# Patient Record
Sex: Female | Born: 1980 | Race: Black or African American | Hispanic: No | Marital: Single | Smoking: Former smoker
Health system: Southern US, Community
[De-identification: ages and names within clinical notes are randomized; demographics above are authoritative.]

## PROBLEM LIST (undated history)

## (undated) HISTORY — PX: TUBAL LIGATION: SHX77

---

## 2015-11-02 ENCOUNTER — Emergency Department (HOSPITAL_BASED_OUTPATIENT_CLINIC_OR_DEPARTMENT_OTHER)
Admission: EM | Admit: 2015-11-02 | Discharge: 2015-11-02 | Disposition: A | Discharge status: Home / Self Care | Payer: Medicaid Other | Attending: Emergency Medicine | Admitting: Emergency Medicine

## 2015-11-02 ENCOUNTER — Encounter (HOSPITAL_BASED_OUTPATIENT_CLINIC_OR_DEPARTMENT_OTHER): Payer: Self-pay | Admitting: *Deleted

## 2015-11-02 DIAGNOSIS — J069 Acute upper respiratory infection, unspecified: Secondary | ICD-10-CM | POA: Diagnosis not present

## 2015-11-02 DIAGNOSIS — F17210 Nicotine dependence, cigarettes, uncomplicated: Secondary | ICD-10-CM | POA: Diagnosis not present

## 2015-11-02 DIAGNOSIS — L0100 Impetigo, unspecified: Secondary | ICD-10-CM | POA: Diagnosis not present

## 2015-11-02 DIAGNOSIS — Z880 Allergy status to penicillin: Secondary | ICD-10-CM | POA: Insufficient documentation

## 2015-11-02 DIAGNOSIS — R21 Rash and other nonspecific skin eruption: Secondary | ICD-10-CM | POA: Diagnosis present

## 2015-11-02 MED ORDER — MUPIROCIN CALCIUM 2 % EX CREA: 1.0000 "application " | TOPICAL_CREAM | Freq: Two times a day (BID) | CUTANEOUS | Status: DC

## 2015-11-02 MED ORDER — CEPHALEXIN 250 MG PO CAPS: 250.0000 mg | ORAL_CAPSULE | Freq: Four times a day (QID) | ORAL | Status: DC

## 2015-11-02 NOTE — ED Provider Notes (Signed)
CSN: [id number]     Arrival date & time 11/02/15  7829 History   First MD Initiated Contact with Patient 11/02/15 [phone]     Chief Complaint  Patient presents with  . Rash     (Consider location/radiation/quality/duration/timing/severity/associated sxs/prior Treatment) Patient is a 35 y.o. female presenting with rash. The history is provided by the patient.  Rash Location:  Face Facial rash location:  Nose Quality: dryness   Severity:  Moderate Onset quality:  Gradual Duration:  4 days Timing:  Constant Progression:  Worsening Chronicity:  New Context comment:  Recent chills, runny nose Relieved by:  Nothing Worsened by:  Nothing tried Ineffective treatments:  None tried Associated symptoms: URI   Associated symptoms: no fever     History reviewed. No pertinent past medical history. History reviewed. No pertinent past surgical history. History reviewed. No pertinent family history. Social History  Substance Use Topics  . Smoking status: Current Some Day Smoker    Types: Cigarettes  . Smokeless tobacco: None  . Alcohol Use: No   OB History    No data available     Review of Systems  Constitutional: Negative for fever.  Skin: Positive for rash.  All other systems reviewed and are negative.     Allergies  Penicillins  Home Medications   Prior to Admission medications   Not on File   BP 116/79 mmHg  Pulse 74  Temp(Src) 98.1 F (36.7 C) (Oral)  Resp 18  Ht  (1.448 m)  Wt 140 lb (63.504 kg)  BMI 30.29 kg/m2  SpO2 100%  LMP 11/02/2015 (Exact Date) Physical Exam  Constitutional: She is oriented to person, place, and time. She appears well-developed and well-nourished. No distress.  HENT:  Head: Normocephalic.  Eyes: Conjunctivae are normal.  Neck: Neck supple. No tracheal deviation present.  Cardiovascular: Normal rate and regular rhythm.   Pulmonary/Chest: Effort normal. No respiratory distress.  Abdominal: Soft. She exhibits no distension.   Neurological: She is alert and oriented to person, place, and time.  Skin: Skin is warm and dry. Rash noted.  Scattered small vesicular lesions with yellow crusting around the nose, philtrum and inside nares  Psychiatric: She has a normal mood and affect.    ED Course  Procedures (including critical care time) Labs Review Labs Reviewed - No data to display  Imaging Review No results found. I have personally reviewed and evaluated these images and lab results as part of my medical decision-making.   EKG Interpretation None      MDM   Final diagnoses:  Impetigo  Viral URI    35 y.o. female presents with rash beneath and inside of her nose over the last 2 days. She had preceding upper respiratory symptoms of runny nose and chills that are improving. It appears she has a secondary impetigo that involves the base of her nose and inside of her nares. Due to sensitivity of the area, topical and systemic antibiotics will be provided.   Lyndal Pulley, MD 11/02/15 [phone]

## 2015-11-02 NOTE — ED Notes (Signed)
States awoke with a rash around mouth this past Thursday, states has been having body aches, HA, and cold symptoms since Tuesday. States has only taken Tylenol for HA. Never has had a reaction to tylenol

## 2015-11-02 NOTE — ED Notes (Signed)
Pt states able to eat, drink, swallow and speech all normal, tracheal sounds clear.

## 2015-11-02 NOTE — ED Notes (Signed)
DC instructions reviewed with pt, discussed importance of hand washing, discussed application of ointment to both nares and importance of completing all abx as prescribed by EDP

## 2015-11-02 NOTE — ED Notes (Signed)
Red rash area with sm circular areas noted immediately below rt and lt nares, appears to be inside both nares as well.

## 2015-11-02 NOTE — ED Notes (Signed)
MD at bedside. 

## 2015-11-02 NOTE — Discharge Instructions (Signed)
Impetigo, Adult  Impetigo is an infection of the skin. It commonly occurs in young children, but it can also occur in adults. The infection causes itchy blisters and sores that produce brownish-yellow fluid. As the fluid dries, it forms a thick, honey-colored crust. These skin changes usually occur on the face but can also affect other areas of the body. Impetigo usually goes away in 7-10 days with treatment.  CAUSES  Impetigo is caused by two types of bacteria. It may be caused by staphylococci or streptococci bacteria. These bacteria cause impetigo when they get under the surface of the skin. This often happens after some damage to the skin, such as damage from:  · Cuts, scrapes, or scratches.  · Insect bites, especially when you scratch the area of a bite.  · Chickenpox or other illnesses that cause open skin sores.  · Nail biting or chewing.  Impetigo is contagious and can spread easily from one person to another. This may occur through close skin contact or by sharing towels, clothing, or other items with a person who has the infection.  RISK FACTORS  Some things that can increase the risk of getting this infection include:  · Playing sports that include skin-to-skin contact with others.  · Having a skin condition with open sores.  · Having many skin cuts or scrapes.  · Living in an area that has high humidity levels.  · Having poor hygiene.  · Having high levels of staphylococci in your nose.  SIGNS AND SYMPTOMS  Impetigo usually starts out as small blisters, often on the face. The blisters then break open and turn into tiny sores (lesions) with a yellow crust. In some cases, the blisters cause itching or burning. With scratching, irritation, or lack of treatment, these small lesions may get larger. Scratching can also cause impetigo to spread to other parts of the body. The bacteria can get under the fingernails and spread when you touch another area of your skin.  Other possible symptoms include:  · Larger  blisters.  · Pus.  · Swollen lymph glands.  DIAGNOSIS  This condition is usually diagnosed during a physical exam. A skin sample or sample of fluid from a blister may be taken for lab tests that involve growing bacteria (culture test). This can help confirm the diagnosis or help determine the best treatment.  TREATMENT  Mild impetigo can be treated with prescription antibiotic cream. Oral antibiotic medicine may be used in more severe cases. Medicines for itching may also be used.  HOME CARE INSTRUCTIONS  · Take medicines only as directed by your health care provider.  · To help prevent impetigo from spreading to other body areas:    Keep your fingernails short and clean.    Do not scratch the blisters or sores.    Cover infected areas, if necessary, to keep from scratching.  · Gently wash the infected areas with antibiotic soap and water.  · Soak crusted areas in warm, soapy water using antibiotic soap.    Gently rub the areas to remove crusts. Do not scrub.  · Wash your hands often to avoid spreading this infection.  · Stay home until you have used an antibiotic cream for 48 hours (2 days) or an oral antibiotic medicine for 24 hours (1 day). You should only return to work and activities with other people if your skin shows significant improvement.  PREVENTION   To keep the infection from spreading:  · Stay home until you have used   an antibiotic cream for 48 hours or an oral antibiotic for 24 hours.  · Wash your hands often.  · Do not engage in skin-to-skin contact with other people while you have still have blisters.  · Do not share towels, washcloths, or bedding with others while you have the infection.  SEEK MEDICAL CARE IF:  · You develop more blisters or sores despite treatment.  · Other family members get sores.  · Your skin sores are not improving after 48 hours of treatment.  · You have a fever.  SEEK IMMEDIATE MEDICAL CARE IF:  · You see spreading redness or swelling of the skin around your sores.  · You  see red streaks coming from your sores.  · You develop a sore throat.     This information is not intended to replace advice given to you by your health care provider. Make sure you discuss any questions you have with your health care provider.     Document Released: 10/12/2014 Document Reviewed: 10/12/2014  Elsevier Interactive Patient Education ©2016 [employer]

## 2017-12-16 ENCOUNTER — Other Ambulatory Visit: Payer: Self-pay

## 2017-12-16 ENCOUNTER — Encounter (HOSPITAL_BASED_OUTPATIENT_CLINIC_OR_DEPARTMENT_OTHER): Payer: Self-pay

## 2017-12-16 DIAGNOSIS — K029 Dental caries, unspecified: Secondary | ICD-10-CM | POA: Diagnosis not present

## 2017-12-16 DIAGNOSIS — F17210 Nicotine dependence, cigarettes, uncomplicated: Secondary | ICD-10-CM | POA: Insufficient documentation

## 2017-12-16 DIAGNOSIS — K0889 Other specified disorders of teeth and supporting structures: Secondary | ICD-10-CM | POA: Diagnosis present

## 2017-12-16 NOTE — ED Triage Notes (Signed)
C/o right lower toothache x 1 week-states filling came out "months"-NAD-steady gait

## 2017-12-17 ENCOUNTER — Emergency Department (HOSPITAL_BASED_OUTPATIENT_CLINIC_OR_DEPARTMENT_OTHER)
Admission: EM | Admit: 2017-12-17 | Discharge: 2017-12-17 | Disposition: A | Discharge status: Home / Self Care | Payer: Medicaid Other | Attending: Emergency Medicine | Admitting: Emergency Medicine

## 2017-12-17 DIAGNOSIS — K029 Dental caries, unspecified: Secondary | ICD-10-CM

## 2017-12-17 MED ORDER — CLINDAMYCIN HCL 150 MG PO CAPS
150.0000 mg | ORAL_CAPSULE | Freq: Once | ORAL | Status: AC
  Administered 2017-12-17: 150 mg via ORAL
  Filled 2017-12-17: qty 1

## 2017-12-17 MED ORDER — CLINDAMYCIN HCL 150 MG PO CAPS: 150.0000 mg | ORAL_CAPSULE | Freq: Four times a day (QID) | ORAL | 0 refills | Status: DC

## 2017-12-17 MED ORDER — TRAMADOL HCL 50 MG PO TABS: 50.0000 mg | ORAL_TABLET | Freq: Four times a day (QID) | ORAL | 0 refills | Status: DC | PRN

## 2017-12-17 NOTE — ED Notes (Signed)
Pt verbalizes understanding of d/c instructions and denies any further needs at this time. 

## 2017-12-17 NOTE — ED Provider Notes (Signed)
[facility] EMERGENCY DEPARTMENT Provider Note   CSN: [id number] Arrival date & time: 12/16/17  2128     History   Chief Complaint Chief Complaint  Patient presents with  . Dental Pain    HPI Doris Valentine is a 37 y.o. female.  The history is provided by the patient.  She states that a filling fell out of a right lower molar about 3 months ago.  She was doing well until 4 days ago when she started having pain in the right lower jaw and has noted some swelling.  She rates pain a 10/10.  Pain is worse with chewing and worse when air hits the tooth.  She denies fever or chills.  She has not been able to see a dentist.  She has been taking Aleve and Tylenol with no relief.  History reviewed. No pertinent past medical history.  There are no active problems to display for this patient.   Past Surgical History:  Procedure Laterality Date  . TUBAL LIGATION      OB History    No data available       Home Medications    Prior to Admission medications   Not on File    Family History No family history on file.  Social History Social History   Tobacco Use  . Smoking status: Current Some Day Smoker    Types: Cigarettes  . Smokeless tobacco: Never Used  Substance Use Topics  . Alcohol use: No  . Drug use: No     Allergies   Penicillins   Review of Systems Review of Systems  All other systems reviewed and are negative.    Physical Exam Updated Vital Signs BP 123/69 (BP Location: Left Arm)   Pulse (!) 55   Temp 98.1 F (36.7 C) (Oral)   Resp 16   Wt 66.1 kg (145 lb 11.6 oz)   LMP 11/27/2017   SpO2 100%   BMI 31.53 kg/m   Physical Exam  Nursing note and vitals reviewed.  37 year old female, resting comfortably and in no acute distress. Vital signs are normal. Oxygen saturation is 100%, which is normal. Head is normocephalic and atraumatic. PERRLA, EOMI. Oropharynx is clear.  Several teeth have been previously extracted.  Tooth #31 has a  large dental carry and is moderately tender to percussion.  No gingival swelling or pallor is seen. Neck is nontender and supple without adenopathy or JVD. Back is nontender and there is no CVA tenderness. Lungs are clear without rales, wheezes, or rhonchi. Chest is nontender. Heart has regular rate and rhythm without murmur. Abdomen is soft, flat, nontender without masses or hepatosplenomegaly and peristalsis is normoactive. Extremities have no cyanosis or edema, full range of motion is present. Skin is warm and dry without rash. Neurologic: Mental status is normal, cranial nerves are intact, there are no motor or sensory deficits.  ED Treatments / Results   Procedures Procedures  Medications Ordered in ED Medications  clindamycin (CLEOCIN) capsule 150 mg (not administered)     Initial Impression / Assessment and Plan / ED Course  I have reviewed the triage vital signs and the nursing notes.  Dental pain from caries.  She is given dental resource guide and is discharged with prescription for clindamycin and small number of tramadol tablets.  All records are reviewed, and she has no relevant past visits.  Final Clinical Impressions(s) / ED Diagnoses   Final diagnoses:  Pain due to dental caries  ED Discharge Orders        Ordered    clindamycin (CLEOCIN) 150 MG capsule  Every 6 hours     12/17/17 0301    traMADol (ULTRAM) 50 MG tablet  Every 6 hours PRN     12/17/17 0301       Dione Booze, MD 12/17/17 [phone]

## 2019-11-30 ENCOUNTER — Other Ambulatory Visit: Payer: Self-pay

## 2019-11-30 ENCOUNTER — Emergency Department (HOSPITAL_BASED_OUTPATIENT_CLINIC_OR_DEPARTMENT_OTHER)
Admission: EM | Admit: 2019-11-30 | Discharge: 2019-11-30 | Disposition: A | Discharge status: Home / Self Care | Payer: Medicaid Other | Attending: Emergency Medicine | Admitting: Emergency Medicine

## 2019-11-30 ENCOUNTER — Encounter (HOSPITAL_BASED_OUTPATIENT_CLINIC_OR_DEPARTMENT_OTHER): Payer: Self-pay | Admitting: Emergency Medicine

## 2019-11-30 DIAGNOSIS — L309 Dermatitis, unspecified: Secondary | ICD-10-CM | POA: Diagnosis not present

## 2019-11-30 DIAGNOSIS — Z79899 Other long term (current) drug therapy: Secondary | ICD-10-CM | POA: Insufficient documentation

## 2019-11-30 DIAGNOSIS — F17210 Nicotine dependence, cigarettes, uncomplicated: Secondary | ICD-10-CM | POA: Diagnosis not present

## 2019-11-30 DIAGNOSIS — M79671 Pain in right foot: Secondary | ICD-10-CM | POA: Diagnosis present

## 2019-11-30 MED ORDER — DIPHENHYDRAMINE HCL 25 MG PO TABS: 25.0000 mg | ORAL_TABLET | Freq: Four times a day (QID) | ORAL | 0 refills | Status: DC | PRN

## 2019-11-30 MED ORDER — CLOTRIMAZOLE 1 % EX CREA: TOPICAL_CREAM | CUTANEOUS | 0 refills | Status: DC

## 2019-11-30 NOTE — ED Provider Notes (Signed)
[facility] EMERGENCY DEPARTMENT Provider Note   CSN: [id number] Arrival date & time: 11/30/19  1839     History Chief Complaint  Patient presents with  . Foot Pain    Doris Valentine is a 39 y.o. female with history of tubal ligation.  Otherwise healthy no daily medication use.  Patient reports that approximately 1 month ago she got a pedicure with her friend.  She reports that they used a gel on the bottom of her feet that she has not used before.  She reports that she developed a mild "irritation" on the bottom of her right foot since that time, over the past 1 week she reports irritation has worsened and it has become "itchy".  She reports a moderate intensity itching sensation constant to the sole of her foot nonradiating worsened with ambulation and without alleviating factors.  She reports that she has developed some blisters to the bottom of her foot over the past month.  She called [facility] this week and has a follow-up appointment with them in 4 days.  Patient reports that she has been applying hydrogen peroxide to the bottom of her foot every night but this is not helped.  She denies any fever/chills, abdominal pain, nausea/vomiting, rash elsewhere, airway swelling, sore throat, difficulty swallowing, diarrhea, numbness/tingling, weakness, swelling or any additional concerns.  HPI     History reviewed. No pertinent past medical history.  There are no problems to display for this patient.   Past Surgical History:  Procedure Laterality Date  . CESAREAN SECTION    . TUBAL LIGATION       OB History   No obstetric history on file.     No family history on file.  Social History   Tobacco Use  . Smoking status: Current Some Day Smoker    Types: Cigarettes  . Smokeless tobacco: Never Used  Substance Use Topics  . Alcohol use: No  . Drug use: No    Home Medications Prior to Admission medications   Medication Sig Start Date End  Date Taking? Authorizing Provider  clindamycin (CLEOCIN) 150 MG capsule Take 1 capsule (150 mg total) by mouth every 6 (six) hours. 10/06/56   Delora Fuel, MD  clotrimazole (LOTRIMIN) 1 % cream Apply to affected area 2 times daily 11/30/19   Nuala Alpha A, PA-C  diphenhydrAMINE (BENADRYL) 25 MG tablet Take 1 tablet (25 mg total) by mouth every 6 (six) hours as needed for up to 5 days. 11/30/19 12/05/19  Nuala Alpha A, PA-C  traMADol (ULTRAM) 50 MG tablet Take 1 tablet (50 mg total) by mouth every 6 (six) hours as needed. 02/28/77   Delora Fuel, MD    Allergies    Penicillins  Review of Systems   Review of Systems  Constitutional: Negative for chills and fever.  HENT: Negative.  Negative for sore throat and trouble swallowing.   Respiratory: Negative.  Negative for cough.   Gastrointestinal: Negative for abdominal pain, diarrhea, nausea and vomiting.  Musculoskeletal: Negative.  Negative for arthralgias, myalgias and neck pain.  Skin: Positive for rash (Only to right sole of foot). Negative for wound.  Neurological: Negative.  Negative for weakness and numbness.    Physical Exam Updated Vital Signs BP (!) 129/92 (BP Location: Right Arm)   Pulse 76   Temp 98.9 F (37.2 C)   Resp 16   Ht 4\' 9"  (1.448 m)   Wt 68 kg   LMP 10/30/2019   SpO2 100%  BMI 32.46 kg/m   Physical Exam Constitutional:      General: She is not in acute distress.    Appearance: Normal appearance. She is well-developed. She is not ill-appearing or diaphoretic.  HENT:     Head: Normocephalic and atraumatic.     Right Ear: External ear normal.     Left Ear: External ear normal.     Nose: Nose normal.  Eyes:     General: Vision grossly intact. Gaze aligned appropriately.     Pupils: Pupils are equal, round, and reactive to light.  Neck:     Trachea: Trachea and phonation normal. No tracheal deviation.  Cardiovascular:     Pulses:          Dorsalis pedis pulses are 2+ on the right side and 2+ on  the left side.       Posterior tibial pulses are 2+ on the right side and 2+ on the left side.  Pulmonary:     Effort: Pulmonary effort is normal. No respiratory distress.  Abdominal:     General: There is no distension.     Palpations: Abdomen is soft.     Tenderness: There is no abdominal tenderness. There is no guarding or rebound.  Musculoskeletal:        General: Normal range of motion.     Cervical back: Normal range of motion.     Right knee: Normal.     Left knee: Normal.     Right lower leg: Normal.     Left lower leg: Normal.     Right ankle: Normal.     Right Achilles Tendon: Normal.     Left ankle: Normal.     Left Achilles Tendon: Normal.     Right foot: Tenderness (No tenderness, itching of the bottom of the foot.  Multiple blisters of various sizes present.  See attached picture.) present.     Left foot: Normal.  Feet:     Right foot:     Protective Sensation: 5 sites tested. 5 sites sensed.     Skin integrity: Blister and erythema present. No ulcer or fissure.     Toenail Condition: Right toenails are normal.     Left foot:     Protective Sensation: 5 sites tested. 5 sites sensed.     Skin integrity: Skin integrity normal.     Toenail Condition: Left toenails are normal.  Skin:    General: Skin is warm and dry.     Capillary Refill: Capillary refill takes less than 2 seconds.  Neurological:     Mental Status: She is alert.     GCS: GCS eye subscore is 4. GCS verbal subscore is 5. GCS motor subscore is 6.     Comments: Speech is clear and goal oriented, follows commands Major Cranial nerves without deficit, no facial droop Moves extremities without ataxia, coordination intact  Psychiatric:        Behavior: Behavior normal.       ED Results / Procedures / Treatments   Labs (all labs ordered are listed, but only abnormal results are displayed) Labs Reviewed - No data to display  EKG None  Radiology No results found.  Procedures Procedures  (including critical care time)  Medications Ordered in ED Medications - No data to display  ED Course  I have reviewed the triage vital signs and the nursing notes.  Pertinent labs & imaging results that were available during my care of the patient were reviewed by me  and considered in my medical decision making (see chart for details).    MDM Rules/Calculators/A&P                     39 year old female without history of immunocompromising disease presents today with worsening itching of the bottom of her right foot along with mild erythema and blister formation as pictured above.  This began after having a pedicure with her friend.  She has no systemic symptoms, no fever/chills or other rashes/lesions.  She is neurovascular intact to the foot with good capillary refill, sensation and range of motion with all joints.  She has no evidence of anaphylaxis or allergic reaction at this time.  She does report that she has been applying hydrogen peroxide to the foot every night which may be worsening her symptoms.  There is no evidence of a bacterial infection today necessitating antibiotics, suspect erythema may be secondary to irritation and skin breakdown from patient's use of hydroperoxide every night.  Additionally suspect her initial symptoms may been from a fungal infection.  Will have patient begin antihistamines and antifungal medications I have encouraged her to [facility].  I have also given her referral to podiatry if for some reason she is unable to be seen by the foot and ankle specialist.  Do not feel any imaging or blood work is indicated at this time.  No evidence of cellulitis, no concern for SJS, TN, TSS, tick borne illness, syphilis or other life-threatening condition at this time.  Of note patient denies chance of pregnancy today, she reports that she has tubal ligation.  Patient states understanding that  medications given or prescribed today may result in harm to of a pregnancy and she accepts these risks and still chooses not to be pregnancy tested and proceed with medications.    At this time there does not appear to be any evidence of an acute emergency medical condition and the patient appears stable for discharge with appropriate outpatient follow up. Diagnosis was discussed with patient who verbalizes understanding of care plan and is agreeable to discharge. I have discussed return precautions with patient who verbalizes understanding of return precautions. Patient encouraged to follow-up with their PCP and podiatry. All questions answered.  Patient's case discussed with Dr. Criss Alvine who agrees with plan to discharge with follow-up.   Note: Portions of this report may have been transcribed using voice recognition software. Every effort was made to ensure accuracy; however, inadvertent computerized transcription errors may still be present. Final Clinical Impression(s) / ED Diagnoses Final diagnoses:  Dermatitis of right foot    Rx / DC Orders ED Discharge Orders         Ordered    clotrimazole (LOTRIMIN) 1 % cream     11/30/19 2051    diphenhydrAMINE (BENADRYL) 25 MG tablet  Every 6 hours PRN     11/30/19 2051           Elizabeth Palau 11/30/19 2053    Pricilla Loveless, MD 12/01/19 [phone]

## 2019-11-30 NOTE — Discharge Instructions (Addendum)
You have been diagnosed today with dermatitis of right foot.  At this time there does not appear to be the presence of an emergent medical condition, however there is always the potential for conditions to change. Please read and follow the below instructions.  Please return to the Emergency Department immediately for any new or worsening symptoms. Please be sure to follow up with your Primary Care Provider within one week regarding your visit today; please call their office to schedule an appointment even if you are feeling better for a follow-up visit. Please stop applying the hydroperoxide and other harsh chemicals to your foot as this is likely worsening your symptoms and causing skin breakdown. You may begin taking the antihistamine Benadryl as prescribed to help with your itching, this medication may make you drowsy so do not drive or perform any dangerous activities after taking Benadryl.  Do not take this medication with alcohol or other sedating medications. Please begin using the antifungal medication prescribed onto the bottom of your foot to help with your symptoms. Please go to your foot and ankle specialist appointment on the third as scheduled.  If you are unable to go to that doctor for some reason you may follow-up with the podiatrist Dr. Gala Lewandowsky under discharge paperwork.  Get help right away if: You have a very bad headache. You have neck pain. Your neck is stiff. You throw up (vomit). You feel very sleepy. You see red streaks coming from the area. Your bone or joint near the area hurts after the skin has healed. The area turns darker. You have trouble breathing. You have fever or swelling You have any new/concerning or worsening of symptoms  Please read the additional information packets attached to your discharge summary.  Do not take your medicine if  develop an itchy rash, swelling in your mouth or lips, or difficulty breathing; call 911 and seek immediate  emergency medical attention if this occurs.  Note: Portions of this text may have been transcribed using voice recognition software. Every effort was made to ensure accuracy; however, inadvertent computerized transcription errors may still be present.

## 2019-11-30 NOTE — ED Triage Notes (Signed)
R foot pain and itching x 1 month. States blisters developed in the past week. Unsure if related to pedicure.

## 2020-02-24 ENCOUNTER — Encounter (HOSPITAL_BASED_OUTPATIENT_CLINIC_OR_DEPARTMENT_OTHER): Payer: Self-pay | Admitting: *Deleted

## 2020-02-24 ENCOUNTER — Emergency Department (HOSPITAL_BASED_OUTPATIENT_CLINIC_OR_DEPARTMENT_OTHER)
Admission: EM | Admit: 2020-02-24 | Discharge: 2020-02-24 | Disposition: A | Discharge status: Home / Self Care | Payer: Medicaid Other | Attending: Emergency Medicine | Admitting: Emergency Medicine

## 2020-02-24 ENCOUNTER — Other Ambulatory Visit: Payer: Self-pay

## 2020-02-24 DIAGNOSIS — Z880 Allergy status to penicillin: Secondary | ICD-10-CM | POA: Insufficient documentation

## 2020-02-24 DIAGNOSIS — N899 Noninflammatory disorder of vagina, unspecified: Secondary | ICD-10-CM | POA: Diagnosis present

## 2020-02-24 DIAGNOSIS — N758 Other diseases of Bartholin's gland: Secondary | ICD-10-CM | POA: Insufficient documentation

## 2020-02-24 DIAGNOSIS — F17210 Nicotine dependence, cigarettes, uncomplicated: Secondary | ICD-10-CM | POA: Insufficient documentation

## 2020-02-24 MED ORDER — CLINDAMYCIN HCL 150 MG PO CAPS: 300.0000 mg | ORAL_CAPSULE | Freq: Three times a day (TID) | ORAL | 0 refills | Status: DC

## 2020-02-24 NOTE — ED Triage Notes (Signed)
Pt c/o ? Abscess to right vaginal area

## 2020-02-24 NOTE — ED Provider Notes (Signed)
[facility] EMERGENCY DEPARTMENT Provider Note  CSN: [id number] Arrival date & time: 02/24/20 0029  Chief Complaint(s) Abscess  HPI Doris Valentine is a 39 y.o. female   HPI CC: groin pain  Onset/Duration: 2 days Timing: constant Location: right groin Quality: aching and shooting Severity: moderate to severe Modifying Factors:  Improved by: nothing  Worsened by: palpating, sitting Associated Signs/Symptoms:  Pertinent (+):none   Pertinent (-): vaginal discharge, bleeding, drainage, redness, swelling, fever, chills, dysuria Context: patient feels a painful nodule  Past Medical History History reviewed. No pertinent past medical history. There are no problems to display for this patient.  Home Medication(s) Prior to Admission medications   Medication Sig Start Date End Date Taking? Authorizing Provider  clindamycin (CLEOCIN) 150 MG capsule Take 1 capsule (150 mg total) by mouth every 6 (six) hours. 12/17/17   Dione Booze, MD  clotrimazole (LOTRIMIN) 1 % cream Apply to affected area 2 times daily 11/30/19   Harlene Salts A, PA-C  diphenhydrAMINE (BENADRYL) 25 MG tablet Take 1 tablet (25 mg total) by mouth every 6 (six) hours as needed for up to 5 days. 11/30/19 12/05/19  Harlene Salts A, PA-C  traMADol (ULTRAM) 50 MG tablet Take 1 tablet (50 mg total) by mouth every 6 (six) hours as needed. 12/17/17   Dione Booze, MD                                                                                                                                    Past Surgical History Past Surgical History:  Procedure Laterality Date  . CESAREAN SECTION    . TUBAL LIGATION     Family History History reviewed. No pertinent family history.  Social History Social History   Tobacco Use  . Smoking status: Current Some Day Smoker    Types: Cigarettes  . Smokeless tobacco: Never Used  Substance Use Topics  . Alcohol use: No  . Drug use: No   Allergies Penicillins  Review  of Systems Review of Systems All other systems are reviewed and are negative for acute change except as noted in the HPI  Physical Exam Vital Signs  I have reviewed the triage vital signs BP (!) 117/56 (BP Location: Right Arm)   Pulse 61   Temp 98.4 F (36.9 C)   Resp 20   Ht 4\' 9"  (1.448 m)   Wt 77.1 kg   LMP 02/08/2020   SpO2 100%   BMI 36.79 kg/m   Physical Exam Vitals reviewed. Exam conducted with a chaperone present.  Constitutional:      General: She is not in acute distress.    Appearance: She is well-developed. She is not diaphoretic.  HENT:     Head: Normocephalic and atraumatic.     Right Ear: External ear normal.     Left Ear: External ear normal.     Nose: Nose normal.  Eyes:     General:  No scleral icterus.    Conjunctiva/sclera: Conjunctivae normal.  Neck:     Trachea: Phonation normal.  Cardiovascular:     Rate and Rhythm: Normal rate and regular rhythm.  Pulmonary:     Effort: Pulmonary effort is normal. No respiratory distress.     Breath sounds: No stridor.  Abdominal:     General: There is no distension.  Genitourinary:   Musculoskeletal:        General: Normal range of motion.     Cervical back: Normal range of motion.  Neurological:     Mental Status: She is alert and oriented to person, place, and time.  Psychiatric:        Behavior: Behavior normal.     ED Results and Treatments Labs (all labs ordered are listed, but only abnormal results are displayed) Labs Reviewed - No data to display                                                                                                                       EKG  EKG Interpretation  Date/Time:    Ventricular Rate:    PR Interval:    QRS Duration:   QT Interval:    QTC Calculation:   R Axis:     Text Interpretation:        Radiology No results found.  Pertinent labs & imaging results that were available during my care of the patient were reviewed by me and considered in my  medical decision making (see chart for details).  Medications Ordered in ED Medications - No data to display                                                                                                                                  Procedures Ultrasound ED Soft Tissue  Date/Time: 02/24/2020 6:06 AM Performed by: Nira Conn, MD Authorized by: Nira Conn, MD   Procedure details:    Indications: localization of abscess     Transverse view:  Visualized   Longitudinal view:  Visualized   Images: archived   Location:    Location: perineum     Side:  Right Findings:     no abscess present    no cellulitis present    no foreign body present Comments:     Inflamed Bartholin cyst    (including critical care time)  Medical Decision Making / ED Course I have  reviewed the nursing notes for this encounter and the patient's prior records (if available in EHR or on provided paperwork).   Doris Valentine was evaluated in Emergency Department on 02/24/2020 for the symptoms described in the history of present illness. She was evaluated in the context of the global COVID-19 pandemic, which necessitated consideration that the patient might be at risk for infection with the SARS-CoV-2 virus that causes COVID-19. Institutional protocols and algorithms that pertain to the evaluation of patients at risk for COVID-19 are in a state of rapid change based on information released by regulatory bodies including the CDC and federal and state organizations. These policies and algorithms were followed during the patient's care in the ED.  Presentation is consistent with inflamed Bartholin cyst.  No drainable abscess noted.  Will provide patient with short course of clindamycin.  Recommend PCP follow-up      Final Clinical Impression(s) / ED Diagnoses Final diagnoses:  None   The patient appears reasonably screened and/or stabilized for discharge and I doubt any other medical  condition or other [facility] requiring further screening, evaluation, or treatment in the ED at this time prior to discharge. Safe for discharge with strict return precautions.  Disposition: Discharge  Condition: Good  I have discussed the results, Dx and Tx plan with the patient/family who expressed understanding and agree(s) with the plan. Discharge instructions discussed at length. The patient/family was given strict return precautions who verbalized understanding of the instructions. No further questions at time of discharge.    ED Discharge Orders         Ordered    clindamycin (CLEOCIN) 150 MG capsule  3 times daily     02/24/20 0603           Follow Up: Glory Buff, MD [place] [address] [phone]  Schedule an appointment as soon as possible for a visit        This chart was dictated using voice recognition software.  Despite best efforts to proofread,  errors can occur which can change the documentation meaning.   Fatima Blank, MD 02/24/20 [phone]

## 2020-12-13 ENCOUNTER — Emergency Department (HOSPITAL_BASED_OUTPATIENT_CLINIC_OR_DEPARTMENT_OTHER)
Admission: EM | Admit: 2020-12-13 | Discharge: 2020-12-13 | Disposition: A | Discharge status: Home / Self Care | Payer: Medicaid Other | Attending: Emergency Medicine | Admitting: Emergency Medicine

## 2020-12-13 ENCOUNTER — Other Ambulatory Visit: Payer: Self-pay

## 2020-12-13 ENCOUNTER — Emergency Department (HOSPITAL_BASED_OUTPATIENT_CLINIC_OR_DEPARTMENT_OTHER): Payer: Medicaid Other

## 2020-12-13 ENCOUNTER — Encounter (HOSPITAL_BASED_OUTPATIENT_CLINIC_OR_DEPARTMENT_OTHER): Payer: Self-pay

## 2020-12-13 DIAGNOSIS — M25512 Pain in left shoulder: Secondary | ICD-10-CM | POA: Insufficient documentation

## 2020-12-13 DIAGNOSIS — Z87891 Personal history of nicotine dependence: Secondary | ICD-10-CM | POA: Insufficient documentation

## 2020-12-13 DIAGNOSIS — R202 Paresthesia of skin: Secondary | ICD-10-CM | POA: Insufficient documentation

## 2020-12-13 DIAGNOSIS — M62838 Other muscle spasm: Secondary | ICD-10-CM | POA: Diagnosis not present

## 2020-12-13 LAB — CBC WITH DIFFERENTIAL/PLATELET
Abs Immature Granulocytes: 0.02 10*3/uL (ref 0.00–0.07)
Basophils Absolute: 0 10*3/uL (ref 0.0–0.1)
Basophils Relative: 0 %
Eosinophils Absolute: 0.3 10*3/uL (ref 0.0–0.5)
Eosinophils Relative: 4 %
HCT: 38.4 % (ref 36.0–46.0)
Hemoglobin: 12.3 g/dL (ref 12.0–15.0)
Immature Granulocytes: 0 %
Lymphocytes Relative: 45 %
Lymphs Abs: 3.3 10*3/uL (ref 0.7–4.0)
MCH: 25.2 pg — ABNORMAL LOW (ref 26.0–34.0)
MCHC: 32 g/dL (ref 30.0–36.0)
MCV: 78.7 fL — ABNORMAL LOW (ref 80.0–100.0)
Monocytes Absolute: 0.6 10*3/uL (ref 0.1–1.0)
Monocytes Relative: 8 %
Neutro Abs: 3.2 10*3/uL (ref 1.7–7.7)
Neutrophils Relative %: 43 %
Platelets: 338 10*3/uL (ref 150–400)
RBC: 4.88 MIL/uL (ref 3.87–5.11)
RDW: 14.7 % (ref 11.5–15.5)
WBC: 7.5 10*3/uL (ref 4.0–10.5)
nRBC: 0 % (ref 0.0–0.2)

## 2020-12-13 LAB — BASIC METABOLIC PANEL
Anion gap: 10 (ref 5–15)
BUN: 9 mg/dL (ref 6–20)
CO2: 26 mmol/L (ref 22–32)
Calcium: 9.5 mg/dL (ref 8.9–10.3)
Chloride: 102 mmol/L (ref 98–111)
Creatinine, Ser: 0.71 mg/dL (ref 0.44–1.00)
GFR, Estimated: >60 mL/min (ref >60)
Glucose, Bld: 104 mg/dL — ABNORMAL HIGH (ref 70–99)
Potassium: 3.6 mmol/L (ref 3.5–5.1)
Sodium: 138 mmol/L (ref 135–145)

## 2020-12-13 LAB — CBG MONITORING, ED: Glucose-Capillary: 110 mg/dL — ABNORMAL HIGH (ref 70–99)

## 2020-12-13 MED ORDER — METHOCARBAMOL 500 MG PO TABS: 500.0000 mg | ORAL_TABLET | Freq: Two times a day (BID) | ORAL | 0 refills | Status: DC

## 2020-12-13 MED ORDER — KETOROLAC TROMETHAMINE 30 MG/ML IJ SOLN
30.0000 mg | Freq: Once | INTRAMUSCULAR | Status: AC
  Administered 2020-12-13: 30 mg via INTRAVENOUS
  Filled 2020-12-13: qty 1

## 2020-12-13 MED ORDER — NAPROXEN 500 MG PO TABS: 500.0000 mg | ORAL_TABLET | Freq: Two times a day (BID) | ORAL | 0 refills | Status: DC

## 2020-12-13 NOTE — ED Notes (Signed)
BEFAST ASSESSMENT NEGATIVE 

## 2020-12-13 NOTE — ED Notes (Signed)
Patient transported to CT via stretcher, sr x 2 up, pt remains NPO

## 2020-12-13 NOTE — ED Triage Notes (Signed)
Pt c/o numbness to left UE and tingling to back of head started while she was driving ~[id number] she had similar episode last summer-did not seek medical tx-NAD-to triage in w/c

## 2020-12-13 NOTE — ED Provider Notes (Signed)
[facility] EMERGENCY DEPARTMENT Provider Note  CSN: [id number] Arrival date & time: 12/13/20 2011    History Chief Complaint  Patient presents with  . Numbness    HPI  Doris Valentine is a 40 y.o. female with no significant PMH who reports while driving about an hour prior to arrival she began to feel generally ill, hot and nauseated. She felt some tingling to the back of her head and like she had weights attached to her left arm. She drive home and her family helped her out of the car. She felt like she was going to pass out but never lost consciousness. She felt better after sitting down but still felt some heaviness in her left arm and L shoulder aching pain. She never had any facial droop, leg weakness or slurred speech. She had a similar episode last summer while she was [place] the beach but felt better after vomiting and never sought medical care.   History reviewed. No pertinent past medical history.  Past Surgical History:  Procedure Laterality Date  . CESAREAN SECTION    . TUBAL LIGATION      No family history on file.  Social History   Tobacco Use  . Smoking status: Former Smoker    Types: Cigarettes  . Smokeless tobacco: Never Used  Substance Use Topics  . Alcohol use: No  . Drug use: No     Home Medications Prior to Admission medications   Medication Sig Start Date End Date Taking? Authorizing Provider  methocarbamol (ROBAXIN) 500 MG tablet Take 1 tablet (500 mg total) by mouth 2 (two) times daily. 12/13/20  Yes Pollyann Savoy, MD  naproxen (NAPROSYN) 500 MG tablet Take 1 tablet (500 mg total) by mouth 2 (two) times daily. 12/13/20  Yes Pollyann Savoy, MD  diphenhydrAMINE (BENADRYL) 25 MG tablet Take 1 tablet (25 mg total) by mouth every 6 (six) hours as needed for up to 5 days. 11/30/19 12/13/20  Harlene Salts A, PA-C     Allergies    Penicillins   Review of Systems   Review of Systems A comprehensive review of systems was completed  and negative except as noted in HPI.    Physical Exam BP (!) 105/56 (BP Location: Left Arm)   Pulse (!) 54   Temp 98.6 F (37 C)   Resp 16   SpO2 100%   Physical Exam Vitals and nursing note reviewed.  Constitutional:      Appearance: Normal appearance.  HENT:     Head: Normocephalic and atraumatic.     Nose: Nose normal.     Mouth/Throat:     Mouth: Mucous membranes are moist.  Eyes:     Extraocular Movements: Extraocular movements intact.     Conjunctiva/sclera: Conjunctivae normal.  Cardiovascular:     Rate and Rhythm: Normal rate.  Pulmonary:     Effort: Pulmonary effort is normal.     Breath sounds: Normal breath sounds.  Abdominal:     General: Abdomen is flat.     Palpations: Abdomen is soft.     Tenderness: There is no abdominal tenderness.  Musculoskeletal:        General: Tenderness (L trapezius muscle tender; worse with arm movement) present. No swelling. Normal range of motion.     Cervical back: Neck supple.  Skin:    General: Skin is warm and dry.  Neurological:     General: No focal deficit present.     Mental Status: She is alert  and oriented to person, place, and time. Mental status is at baseline.     Cranial Nerves: No cranial nerve deficit.     Sensory: No sensory deficit.     Motor: No weakness.     Coordination: Coordination normal.     Gait: Gait normal.     Comments: Visual fields are normal. NIHSS is neg.  Psychiatric:        Mood and Affect: Mood normal.      ED Results / Procedures / Treatments   Labs (all labs ordered are listed, but only abnormal results are displayed) Labs Reviewed  BASIC METABOLIC PANEL - Abnormal; Notable for the following components:      Result Value   Glucose, Bld 104 (*)    All other components within normal limits  CBC WITH DIFFERENTIAL/PLATELET - Abnormal; Notable for the following components:   MCV 78.7 (*)    MCH 25.2 (*)    All other components within normal limits  CBG MONITORING, ED -  Abnormal; Notable for the following components:   Glucose-Capillary 110 (*)    All other components within normal limits    EKG EKG Interpretation  Date/Time:  Friday December 13 2020 20:25:40 EST Ventricular Rate:  56 PR Interval:  150 QRS Duration: 84 QT Interval:  406 QTC Calculation: 391 R Axis:   83 Text Interpretation: Sinus bradycardia Otherwise normal ECG No old tracing to compare Confirmed by Susy Frizzle [phone]) on 12/13/2020 9:35:50 PM    Radiology CT Head Wo Contrast  Result Date: 12/13/2020 CLINICAL DATA:  Left upper extremity numbness and tingling EXAM: CT HEAD WITHOUT CONTRAST TECHNIQUE: Contiguous axial images were obtained from the base of the skull through the vertex without intravenous contrast. COMPARISON:  None. FINDINGS: Brain: No acute infarct or hemorrhage. Lateral ventricles and midline structures are unremarkable. No acute extra-axial fluid collections. No mass effect. Vascular: No hyperdense vessel or unexpected calcification. Skull: Normal. Negative for fracture or focal lesion. Sinuses/Orbits: No acute finding. Other: None. IMPRESSION: 1. No acute intracranial process. Electronically Signed   By: Sharlet Salina M.D.   On: 12/13/2020 21:06    Procedures Procedures  Medications Ordered in the ED Medications  ketorolac (TORADOL) 30 MG/ML injection 30 mg (30 mg Intravenous Given 12/13/20 2135)     MDM Rules/Calculators/A&P MDM Patient with heaviness of her L arm, but no weakness on my exam. Reportedly had weakness with triage RN but no objective neuro deficit at the time of my exam. CT was neg for acute process, labs neg and EKG unremarkable. Suspect this is a MSK problem given shoulder tenderness. Will give a dose of Toradol and reassess.  ED Course  I have reviewed the triage vital signs and the nursing notes.  Pertinent labs & imaging results that were available during my care of the patient were reviewed by me and considered in my medical decision  making (see chart for details).  Clinical Course as of 12/13/20 2325  Fri Dec 13, 2020  2120 CBC normal, CMP normal. CT neg for intracranial process.  [CS]  2250 Patient reports her symptoms are improved with Toradol. Will ask RN for ambulatory trial and if not dizzy plan discharge home.  [CS]  2309 Patient ambulates without difficulty, plan discharge with Rx for NSAIDs, muscle relaxer and PCP follow up.  [CS]    Clinical Course User Index [CS] Pollyann Savoy, MD    Final Clinical Impression(s) / ED Diagnoses Final diagnoses:  Trapezius muscle spasm  Paresthesia  Rx / DC Orders ED Discharge Orders         Ordered    naproxen (NAPROSYN) 500 MG tablet  2 times daily        12/13/20 2324    methocarbamol (ROBAXIN) 500 MG tablet  2 times daily        12/13/20 2324           Pollyann Savoy, MD 12/13/20 2325

## 2021-03-01 ENCOUNTER — Emergency Department (HOSPITAL_BASED_OUTPATIENT_CLINIC_OR_DEPARTMENT_OTHER)
Admission: EM | Admit: 2021-03-01 | Discharge: 2021-03-01 | Disposition: A | Discharge status: Home / Self Care | Payer: Medicaid Other | Attending: Emergency Medicine | Admitting: Emergency Medicine

## 2021-03-01 ENCOUNTER — Other Ambulatory Visit: Payer: Self-pay

## 2021-03-01 ENCOUNTER — Encounter (HOSPITAL_BASED_OUTPATIENT_CLINIC_OR_DEPARTMENT_OTHER): Payer: Self-pay

## 2021-03-01 DIAGNOSIS — M79644 Pain in right finger(s): Secondary | ICD-10-CM | POA: Diagnosis not present

## 2021-03-01 DIAGNOSIS — M79641 Pain in right hand: Secondary | ICD-10-CM

## 2021-03-01 DIAGNOSIS — R202 Paresthesia of skin: Secondary | ICD-10-CM | POA: Insufficient documentation

## 2021-03-01 DIAGNOSIS — Z87891 Personal history of nicotine dependence: Secondary | ICD-10-CM | POA: Diagnosis not present

## 2021-03-01 MED ORDER — NAPROXEN 250 MG PO TABS
500.0000 mg | ORAL_TABLET | Freq: Once | ORAL | Status: AC
  Administered 2021-03-01: 500 mg via ORAL
  Filled 2021-03-01: qty 2

## 2021-03-01 MED ORDER — ACETAMINOPHEN 325 MG PO TABS
650.0000 mg | ORAL_TABLET | Freq: Once | ORAL | Status: AC
  Administered 2021-03-01: 650 mg via ORAL
  Filled 2021-03-01: qty 2

## 2021-03-01 NOTE — ED Provider Notes (Signed)
[facility] EMERGENCY DEPARTMENT Provider Note   CSN: [id number] Arrival date & time: 03/01/21  0254     History Chief Complaint  Patient presents with  . Hand Pain    Doris Valentine is a 40 y.o. female.  The history is provided by the patient.  Hand Pain  She complains of pain in her right third and fourth fingers which started about 4-6 weeks ago.  Symptoms started shortly after she started a new job where she has to use her right hand repetitively.  There is no exposure to vibration.  The discomfort is described as a constant ache but she will not put a number on it.  She does take naproxen before going to work and it seems to help a little bit.  She does not take anything during the rest of the day.  She has tried wearing a wrist brace which does not seem to be helping much.  She also states she has noted numbness across the tips of the fingers of her right hand.  She has not noticed any weakness.   History reviewed. No pertinent past medical history.  There are no problems to display for this patient.   Past Surgical History:  Procedure Laterality Date  . CESAREAN SECTION    . TUBAL LIGATION       OB History   No obstetric history on file.     History reviewed. No pertinent family history.  Social History   Tobacco Use  . Smoking status: Former Smoker    Types: Cigarettes  . Smokeless tobacco: Never Used  Substance Use Topics  . Alcohol use: No  . Drug use: No    Home Medications Prior to Admission medications   Medication Sig Start Date End Date Taking? Authorizing Provider  methocarbamol (ROBAXIN) 500 MG tablet Take 1 tablet (500 mg total) by mouth 2 (two) times daily. 12/13/20   Pollyann Savoy, MD  naproxen (NAPROSYN) 500 MG tablet Take 1 tablet (500 mg total) by mouth 2 (two) times daily. 12/13/20   Pollyann Savoy, MD  diphenhydrAMINE (BENADRYL) 25 MG tablet Take 1 tablet (25 mg total) by mouth every 6 (six) hours as needed for up to 5  days. 11/30/19 12/13/20  Harlene Salts A, PA-C    Allergies    Penicillins  Review of Systems   Review of Systems  All other systems reviewed and are negative.   Physical Exam Updated Vital Signs BP 119/72 (BP Location: Left Arm)   Pulse (!) 59   Temp 98.2 F (36.8 C) (Oral)   Resp 16   Ht 4\' 9"  (1.448 m)   Wt 68 kg   LMP 03/01/2021   SpO2 100%   BMI 32.46 kg/m   Physical Exam Vitals and nursing note reviewed.   40 year old female, resting comfortably and in no acute distress. Vital signs are normal. Oxygen saturation is 100%, which is normal. Head is normocephalic and atraumatic. PERRLA, EOMI. Oropharynx is clear. Neck is nontender and supple without adenopathy or JVD. Back is nontender and there is no CVA tenderness. Lungs are clear without rales, wheezes, or rhonchi. Chest is nontender. Heart has regular rate and rhythm without murmur. Abdomen is soft, flat, nontender. Extremities have no cyanosis or edema, full range of motion is present.  There is no swelling noted and no tenderness.  Phalen and Tinel's tests are negative.  Capillary refill is prompt.  There is normal strength of pincer grasp, finger abduction, finger abduction.  Slight pain is elicited with flexion extension of the right third and fourth fingers against resistance. Skin is warm and dry without rash. Neurologic: Mental status is normal, cranial nerves are intact.  Motor strength is 5/5 in all 4 extremities.  There is decreased sensation of the distal phalanx of the right third and fourth fingers, no other sensory deficits identified.  ED Results / Procedures / Treatments    Procedures Procedures   Medications Ordered in ED Medications  acetaminophen (TYLENOL) tablet 650 mg (has no administration in time range)  naproxen (NAPROSYN) tablet 500 mg (has no administration in time range)    ED Course  I have reviewed the triage vital signs and the nursing notes.  MDM Rules/Calculators/A&P                          Pain of the right third and fourth fingers of uncertain cause.  This is likely related to her new job involving repetitive motion, but no signs of tendinitis or carpal tunnel syndrome on exam.  Patient is advised to increase her naproxen to the anti-inflammatory dose of 2 tablets, twice a day.  Advised to supplement with acetaminophen as needed.  She is referred to sports medicine for follow-up.  Patient also requests finger splints to help keep her finger straight while sleeping, and these are provided.  Old records are reviewed, and she has no relevant past visits.  Final Clinical Impression(s) / ED Diagnoses Final diagnoses:  Right hand pain    Rx / DC Orders ED Discharge Orders    None       Dione Booze, MD 03/01/21 [phone]

## 2021-03-01 NOTE — Discharge Instructions (Signed)
Continue to take Aleve - two tablets twice a day.  Take acetaminophen every 6 hours as needed, to get additional pain relief.  Continue wearing your wrist brace at work, and as needed at home.  Follow-up with the sports medicine physician.

## 2021-03-01 NOTE — ED Triage Notes (Signed)
Pt reports right 3rd and 4th finger pain. States her whole hand hurts but those fingers are worse. Denies injury. States it has been going on for "quite a while" but tonight she has "just had enough of the pain."

## 2021-03-31 ENCOUNTER — Encounter: Payer: Self-pay | Admitting: Family Medicine

## 2021-03-31 ENCOUNTER — Other Ambulatory Visit: Payer: Self-pay

## 2021-03-31 ENCOUNTER — Ambulatory Visit (INDEPENDENT_AMBULATORY_CARE_PROVIDER_SITE_OTHER): Payer: Medicaid Other | Admitting: Family Medicine

## 2021-03-31 ENCOUNTER — Ambulatory Visit: Payer: Self-pay

## 2021-03-31 VITALS — BP 104/72 | Ht <= 58 in | Wt 150.0 lb

## 2021-03-31 DIAGNOSIS — M79641 Pain in right hand: Secondary | ICD-10-CM

## 2021-03-31 DIAGNOSIS — M5412 Radiculopathy, cervical region: Secondary | ICD-10-CM | POA: Diagnosis present

## 2021-03-31 MED ORDER — PREDNISONE 5 MG PO TABS: ORAL_TABLET | ORAL | 0 refills | Status: DC

## 2021-03-31 NOTE — Progress Notes (Signed)
  Doris Valentine - 40 y.o. female MRN [id number]  Date of birth: January 09, 1981  SUBJECTIVE:  Including CC & ROS.  No chief complaint on file.   Doris Valentine is a 40 y.o. female that is presenting with right hand and arm pain.  Symptoms have been ongoing for a few d weeks.  She does repetitive activity at her job.  Seems to be getting worse.   Review of Systems See HPI   HISTORY: Past Medical, Surgical, Social, and Family History Reviewed & Updated per EMR.   Pertinent Historical Findings include:  History reviewed. No pertinent past medical history.  Past Surgical History:  Procedure Laterality Date   CESAREAN SECTION     TUBAL LIGATION      History reviewed. No pertinent family history.  Social History   Socioeconomic History   Marital status: Single    Spouse name: Not on file   Number of children: Not on file   Years of education: Not on file   Highest education level: Not on file  Occupational History   Not on file  Tobacco Use   Smoking status: Former    Pack years: 0.00    Types: Cigarettes   Smokeless tobacco: Never  Substance and Sexual Activity   Alcohol use: No   Drug use: No   Sexual activity: Not on file  Other Topics Concern   Not on file  Social History Narrative   Not on file   Social Determinants of Health   Financial Resource Strain: Not on file  Food Insecurity: Not on file  Transportation Needs: Not on file  Physical Activity: Not on file  Stress: Not on file  Social Connections: Not on file  Intimate Partner Violence: Not on file     PHYSICAL EXAM:  VS: BP 104/72 (BP Location: Left Arm, Patient Position: Sitting, Cuff Size: Large)   Ht 4\' 9"  (1.448 m)   Wt 150 lb (68 kg)   LMP 03/01/2021   BMI 32.46 kg/m  Physical Exam Gen: NAD, alert, cooperative with exam, well-appearing MSK:  Right arm: No signs of atrophy Normal strength resistance. Normal elbow range of motion. Normal shoulder range of motion. Neurovascular  intact  Limited ultrasound: Right wrist:  Normal-appearing median nerves. No changes of the flexor tendons of the middle finger. No changes of the dorsal wrist compartment  Summary: No structural changes  Ultrasound and interpretation by 03/03/2021, MD    ASSESSMENT & PLAN:   Cervical radiculopathy Symptoms are most consistent with radicular pain as she does hold her arm in a specific manner while she is at work. -Counseled on home exercise therapy and supportive care -Prednisone. -Could consider physical therapy or gabapentin.

## 2021-03-31 NOTE — Patient Instructions (Signed)
Nice to meet you Please try heat on the neck and shoulder  Please try the exercises   Please send me a message in MyChart with any questions or updates.  Please see me back in 3 weeks.   --Dr. Jordan Likes

## 2021-04-01 DIAGNOSIS — M5412 Radiculopathy, cervical region: Secondary | ICD-10-CM | POA: Insufficient documentation

## 2021-04-01 NOTE — Assessment & Plan Note (Signed)
Symptoms are most consistent with radicular pain as she does hold her arm in a specific manner while she is at work. -Counseled on home exercise therapy and supportive care -Prednisone. -Could consider physical therapy or gabapentin.

## 2021-04-21 ENCOUNTER — Encounter: Payer: Self-pay | Admitting: Family Medicine

## 2021-04-21 ENCOUNTER — Other Ambulatory Visit: Payer: Self-pay

## 2021-04-21 ENCOUNTER — Ambulatory Visit (INDEPENDENT_AMBULATORY_CARE_PROVIDER_SITE_OTHER): Payer: Medicaid Other | Admitting: Family Medicine

## 2021-04-21 VITALS — BP 100/70 | Ht <= 58 in | Wt 150.0 lb

## 2021-04-21 DIAGNOSIS — G5601 Carpal tunnel syndrome, right upper limb: Secondary | ICD-10-CM | POA: Diagnosis not present

## 2021-04-21 NOTE — Progress Notes (Signed)
  Doris Valentine - 40 y.o. female MRN [id number]  Date of birth: Feb 28, 1981  SUBJECTIVE:  Including CC & ROS.  No chief complaint on file.   Doris Valentine is a 40 y.o. female that is presenting with more right hand pain and arm pain.  Symptoms are worse at night and in the morning.  Denies any significant symptoms through the course of the day.  It is mainly the third and fourth digit.   Review of Systems See HPI   HISTORY: Past Medical, Surgical, Social, and Family History Reviewed & Updated per EMR.   Pertinent Historical Findings include:  History reviewed. No pertinent past medical history.  Past Surgical History:  Procedure Laterality Date   CESAREAN SECTION     TUBAL LIGATION      History reviewed. No pertinent family history.  Social History   Socioeconomic History   Marital status: Single    Spouse name: Not on file   Number of children: Not on file   Years of education: Not on file   Highest education level: Not on file  Occupational History   Not on file  Tobacco Use   Smoking status: Former    Types: Cigarettes   Smokeless tobacco: Never  Substance and Sexual Activity   Alcohol use: No   Drug use: No   Sexual activity: Not on file  Other Topics Concern   Not on file  Social History Narrative   Not on file   Social Determinants of Health   Financial Resource Strain: Not on file  Food Insecurity: Not on file  Transportation Needs: Not on file  Physical Activity: Not on file  Stress: Not on file  Social Connections: Not on file  Intimate Partner Violence: Not on file     PHYSICAL EXAM:  VS: BP 100/70 (BP Location: Left Arm, Patient Position: Sitting, Cuff Size: Large)   Ht 4\' 9"  (1.448 m)   Wt 150 lb (68 kg)   BMI 32.46 kg/m  Physical Exam Gen: NAD, alert, cooperative with exam, well-appearing       ASSESSMENT & PLAN:   Carpal tunnel syndrome of right wrist Symptoms seem more consistent with carpal tunnel disease as opposed to radicular  origin at this point. -Counseled on home exercise therapy and supportive care. -Brace. -Could consider injection physical therapy.

## 2021-04-21 NOTE — Patient Instructions (Signed)
Good to see you Please try the brace at night  Please try the exercises   Please send me a message in MyChart with any questions or updates.  Please see me back in 4 weeks.   --Dr. Joedy Eickhoff  

## 2021-04-21 NOTE — Assessment & Plan Note (Signed)
Symptoms seem more consistent with carpal tunnel disease as opposed to radicular origin at this point. -Counseled on home exercise therapy and supportive care. -Brace. -Could consider injection physical therapy.

## 2021-04-29 ENCOUNTER — Emergency Department (HOSPITAL_BASED_OUTPATIENT_CLINIC_OR_DEPARTMENT_OTHER)
Admission: EM | Admit: 2021-04-29 | Discharge: 2021-04-30 | Disposition: A | Discharge status: Home / Self Care | Payer: Medicaid Other | Attending: Emergency Medicine | Admitting: Emergency Medicine

## 2021-04-29 ENCOUNTER — Encounter (HOSPITAL_BASED_OUTPATIENT_CLINIC_OR_DEPARTMENT_OTHER): Payer: Self-pay

## 2021-04-29 ENCOUNTER — Other Ambulatory Visit: Payer: Self-pay

## 2021-04-29 DIAGNOSIS — Z87891 Personal history of nicotine dependence: Secondary | ICD-10-CM | POA: Diagnosis not present

## 2021-04-29 DIAGNOSIS — R519 Headache, unspecified: Secondary | ICD-10-CM | POA: Insufficient documentation

## 2021-04-29 MED ORDER — KETOROLAC TROMETHAMINE 60 MG/2ML IM SOLN
60.0000 mg | Freq: Once | INTRAMUSCULAR | Status: AC
Start: 2021-04-29 — End: 2021-04-29
  Administered 2021-04-29: 60 mg via INTRAMUSCULAR
  Filled 2021-04-29: qty 2

## 2021-04-29 NOTE — ED Provider Notes (Signed)
[facility] EMERGENCY DEPARTMENT Provider Note   CSN: [id number] Arrival date & time: 04/29/21  2051     History Chief Complaint  Patient presents with   Headache    Doris Valentine is a 40 y.o. female.  Patient is a 40 year old female with no significant past medical history.  She presents today with complaints of headache.  She woke up this way this morning.  She describes a pressure to the front of her head that she believes may be related to sinuses.  She denies any fevers or chills.  She denies any injury or trauma.  She denies any visual disturbances, numbness, or weakness.  The history is provided by the patient.  Headache Pain location:  Frontal Quality:  Dull Radiates to:  Does not radiate Onset quality:  Sudden Duration:  12 hours Timing:  Constant Progression:  Unchanged Chronicity:  New Relieved by:  Nothing Worsened by:  Nothing Ineffective treatments:  Acetaminophen     History reviewed. No pertinent past medical history.  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     No family history on file.  Social History   Tobacco Use   Smoking status: Former    Types: Cigarettes   Smokeless tobacco: Never  Vaping Use   Vaping Use: Never used  Substance Use Topics   Alcohol use: No   Drug use: No    Home Medications Prior to Admission medications   Medication Sig Start Date End Date Taking? Authorizing Provider  methocarbamol (ROBAXIN) 500 MG tablet Take 1 tablet (500 mg total) by mouth 2 (two) times daily. 12/13/20   Pollyann Savoy, MD  naproxen (NAPROSYN) 500 MG tablet Take 1 tablet (500 mg total) by mouth 2 (two) times daily. 12/13/20   Pollyann Savoy, MD  predniSONE (DELTASONE) 5 MG tablet  Take 6 pills for first day, 5 pills second day, 4 pills third day, 3 pills fourth day, 2 pills the fifth day, and 1 pill sixth day. 03/31/21   Myra Rude, MD  diphenhydrAMINE (BENADRYL) 25 MG tablet Take 1 tablet (25 mg total) by mouth every 6 (six) hours as needed for up to 5 days. 11/30/19 12/13/20  Harlene Salts A, PA-C    Allergies    Penicillins  Review of Systems   Review of Systems  Neurological:  Positive for headaches.  All other systems reviewed and are negative.  Physical Exam Updated Vital Signs BP (!) 92/57 (BP Location: Left Arm)   Pulse 67   Temp 98.2 F (36.8 C) (Oral)   Resp 18   Ht 4\' 9"  (1.448 m)   Wt 68 kg   SpO2 100%   BMI 32.46 kg/m   Physical Exam Vitals and nursing note reviewed.  Constitutional:      General: She is not in acute distress.    Appearance: She is well-developed. She is not diaphoretic.  HENT:     Head: Normocephalic and atraumatic.  Eyes:     General: No visual field deficit.    Extraocular Movements: Extraocular movements intact.     Pupils: Pupils are equal, round, and reactive to light.  Cardiovascular:     Rate and Rhythm: Normal rate  and regular rhythm.     Heart sounds: No murmur heard.   No friction rub. No gallop.  Pulmonary:     Effort: Pulmonary effort is normal. No respiratory distress.     Breath sounds: Normal breath sounds. No wheezing.  Abdominal:     General: Bowel sounds are normal. There is no distension.     Palpations: Abdomen is soft.     Tenderness: There is no abdominal tenderness.  Musculoskeletal:        General: Normal range of motion.     Cervical back: Normal range of motion and neck supple.  Skin:    General: Skin is warm and dry.  Neurological:     General: No focal deficit present.     Mental Status: She is alert and oriented to person, place, and time. Mental status is at baseline.     Cranial Nerves: No cranial nerve deficit, dysarthria or facial asymmetry.     Motor: No weakness.      Coordination: Coordination normal.    ED Results / Procedures / Treatments   Labs (all labs ordered are listed, but only abnormal results are displayed) Labs Reviewed - No data to display  EKG None  Radiology No results found.  Procedures Procedures   Medications Ordered in ED Medications  ketorolac (TORADOL) injection 60 mg (has no administration in time range)    ED Course  I have reviewed the triage vital signs and the nursing notes.  Pertinent labs & imaging results that were available during my care of the patient were reviewed by me and considered in my medical decision making (see chart for details).    MDM Rules/Calculators/A&P  Patient presenting with headache with nonfocal neuro exam.  This started this morning.  She is feeling better after receiving Toradol.  Patient is concerned this may be a sinus headache, but as symptoms began this morning I feel as though decongestants and over-the-counter medications are appropriate for now.  No indication for antibiotics at this time.  Final Clinical Impression(s) / ED Diagnoses Final diagnoses:  None    Rx / DC Orders ED Discharge Orders     None        Geoffery Lyons, MD 04/30/21 [phone]

## 2021-04-29 NOTE — ED Triage Notes (Signed)
Reports headache and sleeping all day.  Reports has no energy.  Took tylenol this am with no relief.

## 2021-04-30 NOTE — Discharge Instructions (Addendum)
Take Tylenol 1000 mg rotated with ibuprofen 600 mg every 4 hours as needed for pain.  Return to the ER symptoms significantly worsen or change.

## 2021-05-19 ENCOUNTER — Ambulatory Visit: Payer: Medicaid Other | Admitting: Family Medicine

## 2021-05-19 NOTE — Progress Notes (Deleted)
  Marqueta Pulley - 40 y.o. female MRN [id number]  Date of birth: [birth year]  SUBJECTIVE:  Including CC & ROS.  No chief complaint on file.   Ruta Capece is a 40 y.o. female that is  ***.  ***   Review of Systems See HPI   HISTORY: Past Medical, Surgical, Social, and Family History Reviewed & Updated per EMR.   Pertinent Historical Findings include:  No past medical history on file.  Past Surgical History:  Procedure Laterality Date   CESAREAN SECTION     TUBAL LIGATION      No family history on file.  Social History   Socioeconomic History   Marital status: Single    Spouse name: Not on file   Number of children: Not on file   Years of education: Not on file   Highest education level: Not on file  Occupational History   Not on file  Tobacco Use   Smoking status: Former    Types: Cigarettes   Smokeless tobacco: Never  Vaping Use   Vaping Use: Never used  Substance and Sexual Activity   Alcohol use: No   Drug use: No   Sexual activity: Not on file  Other Topics Concern   Not on file  Social History Narrative   Not on file   Social Determinants of Health   Financial Resource Strain: Not on file  Food Insecurity: Not on file  Transportation Needs: Not on file  Physical Activity: Not on file  Stress: Not on file  Social Connections: Not on file  Intimate Partner Violence: Not on file     PHYSICAL EXAM:  VS: There were no vitals taken for this visit. Physical Exam Gen: NAD, alert, cooperative with exam, well-appearing MSK:  ***      ASSESSMENT & PLAN:   No problem-specific Assessment & Plan notes found for this encounter.

## 2021-12-09 ENCOUNTER — Encounter (HOSPITAL_BASED_OUTPATIENT_CLINIC_OR_DEPARTMENT_OTHER): Payer: Self-pay | Admitting: Emergency Medicine

## 2021-12-09 ENCOUNTER — Emergency Department (HOSPITAL_BASED_OUTPATIENT_CLINIC_OR_DEPARTMENT_OTHER)
Admission: EM | Admit: 2021-12-09 | Discharge: 2021-12-09 | Disposition: A | Discharge status: Home / Self Care | Payer: Medicaid Other | Attending: Emergency Medicine | Admitting: Emergency Medicine

## 2021-12-09 ENCOUNTER — Other Ambulatory Visit: Payer: Self-pay

## 2021-12-09 DIAGNOSIS — H9202 Otalgia, left ear: Secondary | ICD-10-CM | POA: Insufficient documentation

## 2021-12-09 MED ORDER — AZITHROMYCIN 250 MG PO TABS: ORAL_TABLET | ORAL | 0 refills | Status: AC

## 2021-12-09 NOTE — ED Triage Notes (Signed)
Patient arrived via POV c/o left ear pain x 1 day. Patient states taking shower yesterday, feeling water trapped in ear. Patient at work when trapped water escaped. Patient states 2/10. Patient is AO x 4, VS WDL, normal gait. ?

## 2021-12-09 NOTE — ED Provider Notes (Signed)
?[facility] EMERGENCY DEPARTMENT ?Provider Note ? ? ?CSN: [id number] ?Arrival date & time: 12/09/21  0227 ? ?  ? ?History ? ?Chief Complaint  ?Patient presents with  ? Otalgia  ? ? ?Doris Valentine is a 41 y.o. female. ? ?The history is provided by the patient.  ?Otalgia ?Location:  Left ?Quality:  Aching and dull ?Severity:  Moderate ?Onset quality:  Gradual ?Duration:  2 days ?Timing:  Constant ?Progression:  Worsening ?Chronicity:  New ?Relieved by:  None tried ?Worsened by:  Nothing ?Associated symptoms: ear discharge   ?Associated symptoms: no fever   ? ?Patient reports left ear pain for about 2 days.  When she was at work earlier she noticed fluid coming from her ear with small blood tinge.  No trauma to the ear.  She has not placed anything in her ear.  She does not swim.  No previous ear surgery.  She does use ear buds to listen to music at work ? ?Home Medications ?Prior to Admission medications   ?Medication Sig Start Date End Date Taking? Authorizing Provider  ?azithromycin (ZITHROMAX Z-PAK) 250 MG tablet 2 po day one, then 1 daily x 4 days 12/09/21  Yes Zadie Rhine, MD  ?diphenhydrAMINE (BENADRYL) 25 MG tablet Take 1 tablet (25 mg total) by mouth every 6 (six) hours as needed for up to 5 days. 11/30/19 12/13/20  Bill Salinas, PA-C  ?   ? ?Allergies    ?Penicillins   ? ?Review of Systems   ?Review of Systems  ?Constitutional:  Negative for fever.  ?HENT:  Positive for ear discharge and ear pain.   ? ?Physical Exam ?Updated Vital Signs ?BP (!) 117/91 (BP Location: Right Arm)   Pulse 66   Temp 98 ?F (36.7 ?C) (Oral)   Resp 17   Ht 1.448 m (4\' 9" )   Wt 64.9 kg   LMP  (LMP Unknown)   SpO2 98%   BMI 30.94 kg/m?  ?Physical Exam ?CONSTITUTIONAL: Well developed/well nourished ?HEAD: Normocephalic/atraumatic ?EYES: EOMI/PERRL ?ENMT: Mucous membranes moist, right TM occluded by cerumen.  Left TM observed, no erythema, no significant drainage or bleeding.  No obvious fractures noted ?NECK:  supple no meningeal signs ?CV: S1/S2 noted ?LUNGS: Lungs are clear to auscultation bilaterally, no apparent distress ?NEURO: Pt is awake/alert/appropriate, moves all extremitiesx4.    ?EXTREMITIES:  full ROM ?SKIN: warm, color normal ?PSYCH: no abnormalities of mood noted, alert and oriented to situation ? ?ED Results / Procedures / Treatments   ?Labs ?(all labs ordered are listed, but only abnormal results are displayed) ?Labs Reviewed - No data to display ? ?EKG ?None ? ?Radiology ?No results found. ? ?Procedures ?Procedures  ? ? ?Medications Ordered in ED ?Medications - No data to display ? ?ED Course/ Medical Decision Making/ A&P ?  ?                        ?Medical Decision Making ?Risk ?Prescription drug management. ? ? ?Patient well-appearing.  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  We will start antibiotics.  Refer to ENT.  Advised to not put any liquids, water or any other objects in her ear. ? ? ? ? ? ? ? ? ?Final Clinical Impression(s) / ED Diagnoses ?Final diagnoses:  ?Left ear pain  ? ? ?Rx / DC Orders ?ED Discharge Orders   ? ?      Ordered  ?  azithromycin (  ZITHROMAX Z-PAK) 250 MG tablet       ? 12/09/21 0246  ? ?  ?  ? ?  ? ? ?  ?Zadie Rhine, MD ?12/09/21 0255 ? ?

## 2022-08-04 IMAGING — CT CT HEAD W/O CM
3 series · 15 of 47 positions shown, 18 images · non-contrast
Comparison: None.

CLINICAL DATA: Left upper extremity numbness and tingling

EXAM:
CT HEAD WITHOUT CONTRAST
TECHNIQUE: Contiguous axial images were obtained from the base of the skull
through the vertex without intravenous contrast.

[Series 2: head wo · axial · 0.40mm/px · z∈[+791,+921]mm · 9 of 32 slices shown, 12 images]
[im 3/32  brain]
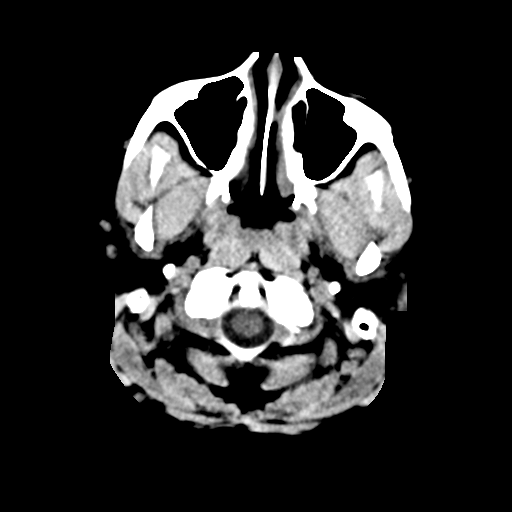
[im 3/32  bone]
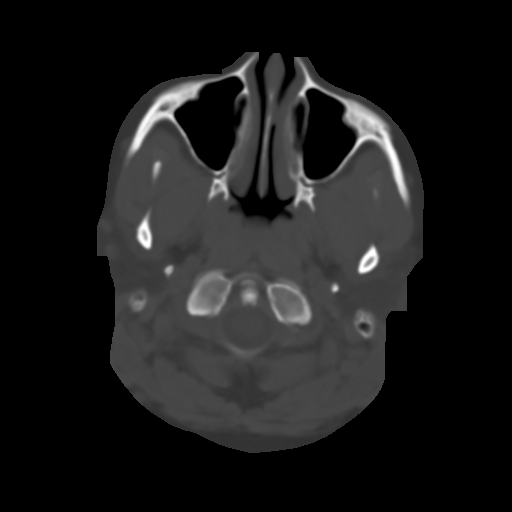
[im 6/32  brain]
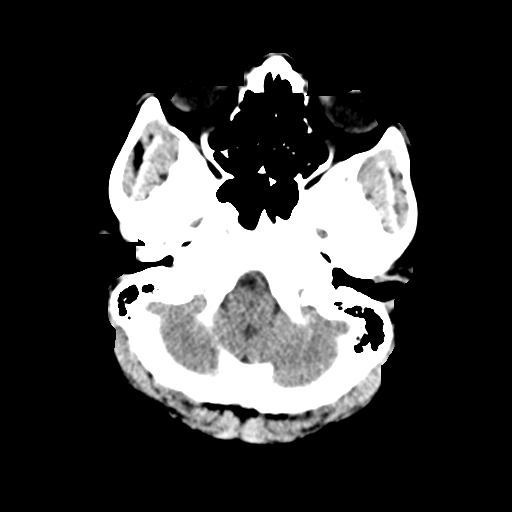
[im 9/32  brain]
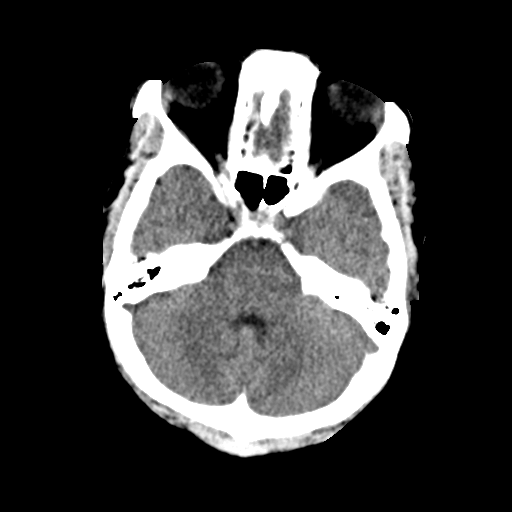
[im 12/32  brain]
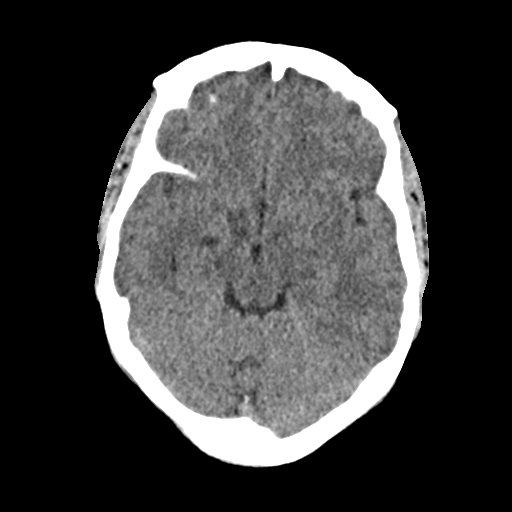
[im 17/32  brain]
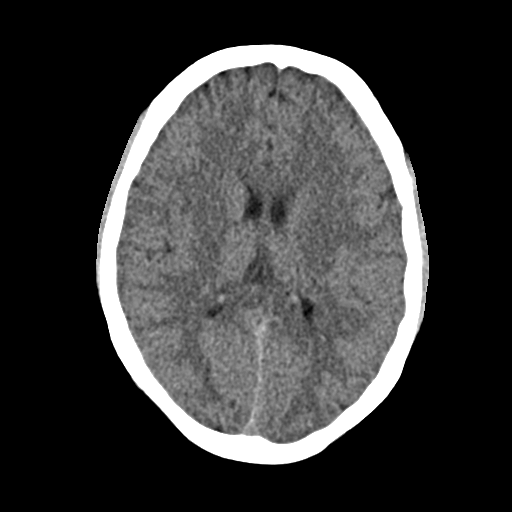
[im 17/32  bone]
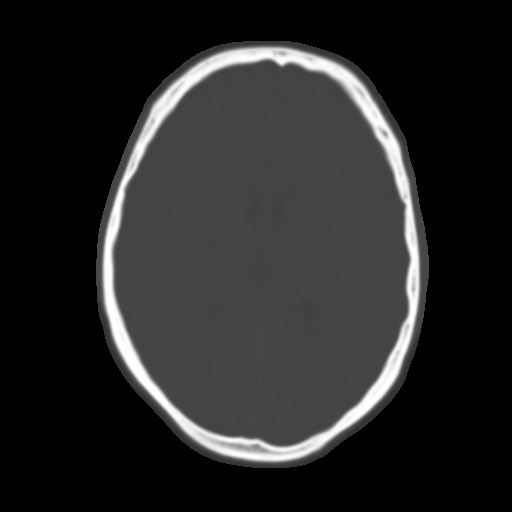
[im 20/32  brain]
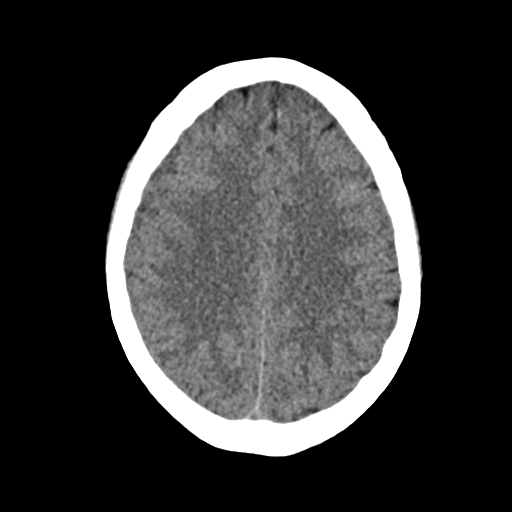
[im 23/32  brain]
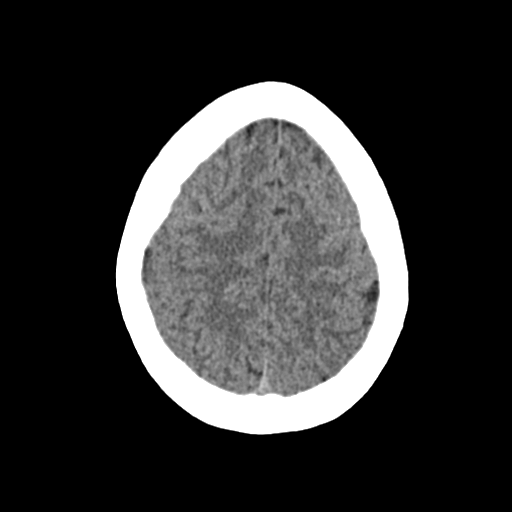
[im 26/32  brain]
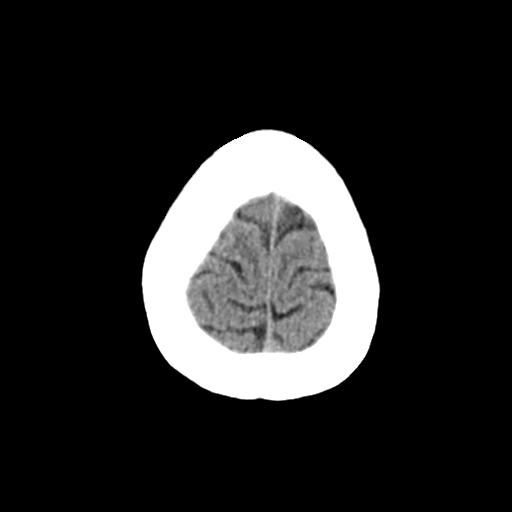
[im 29/32  brain]
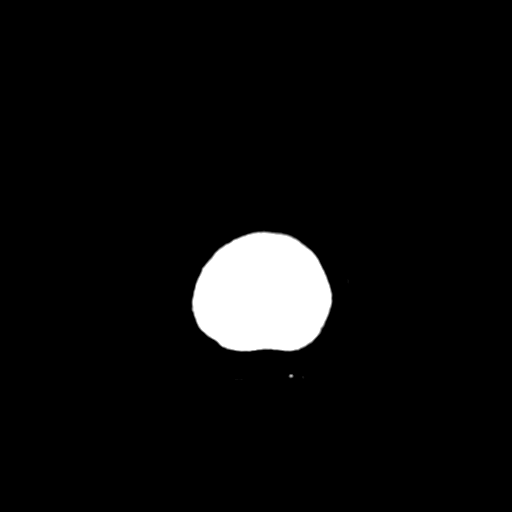
[im 29/32  bone]
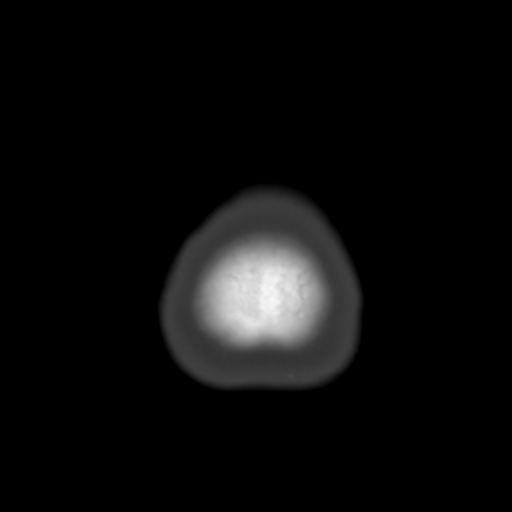

[Series 4: coronal soft · coronal · 0.31mm/px · 3 of 67 slices shown]
[im 23/67  brain]
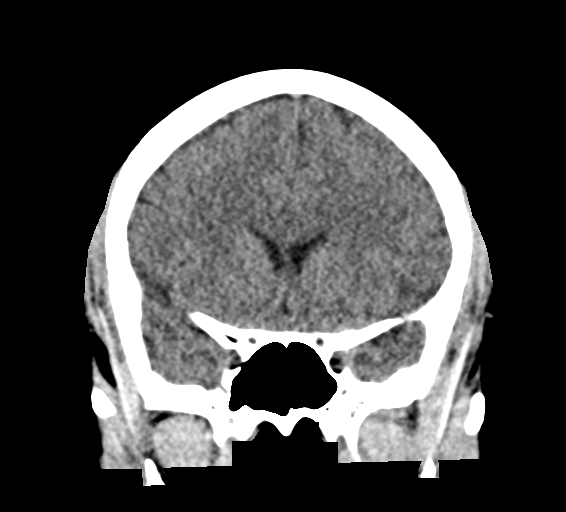
[im 30/67  brain]
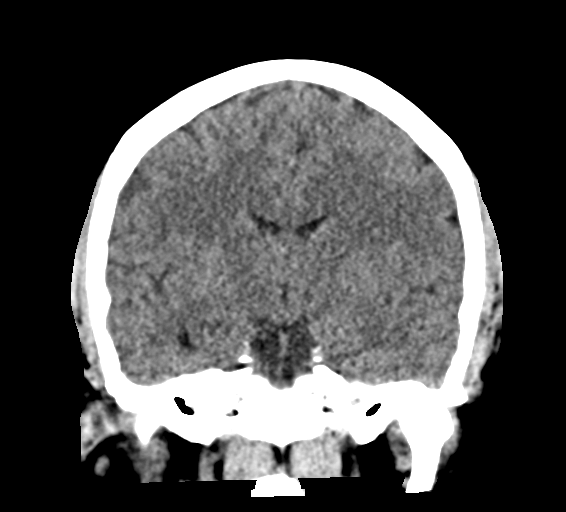
[im 37/67  brain]
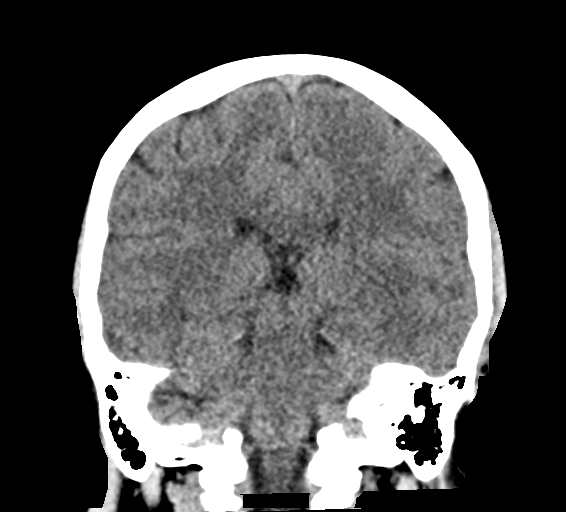

[Series 5: sag soft · sagittal · 0.31mm/px · 3 of 56 slices shown]
[im 19/56  brain]
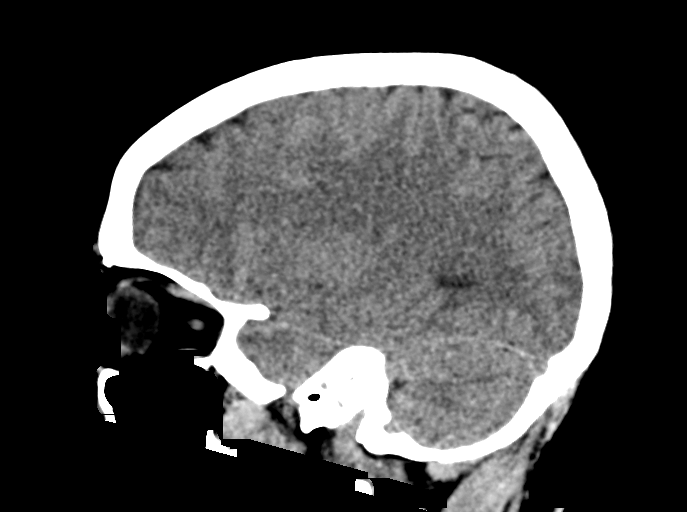
[im 28/56  brain]
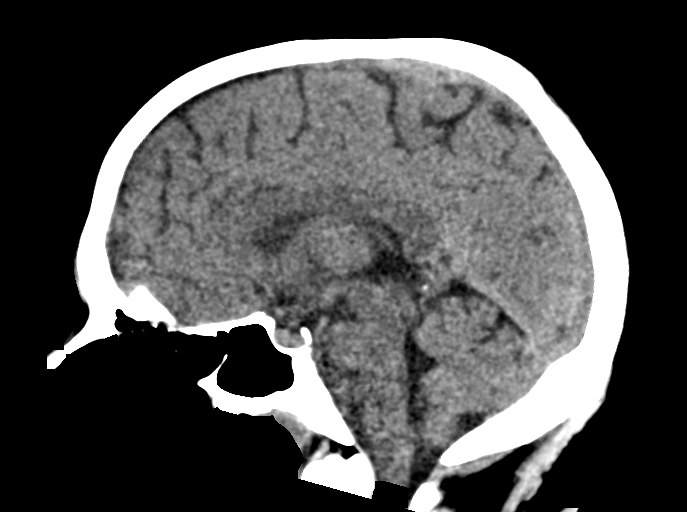
[im 37/56  brain]
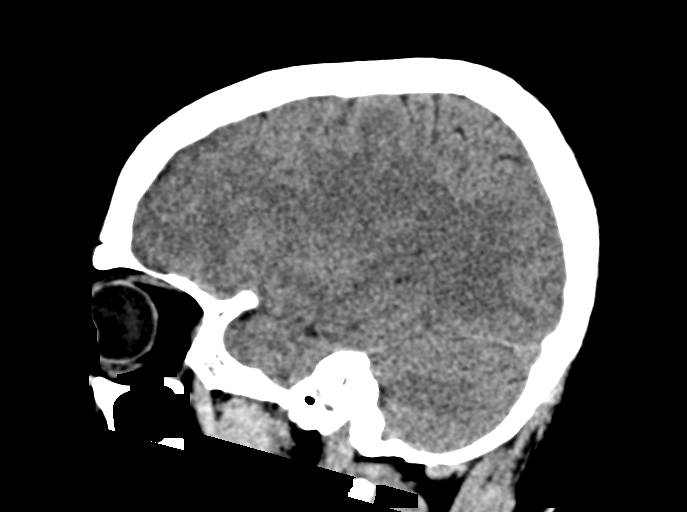

[15 of 47 positions shown; findings below may reference images not displayed]

FINDINGS: Brain: No acute infarct or hemorrhage. Lateral ventricles and
midline structures are unremarkable. No acute extra-axial fluid
collections. No mass effect.

Vascular: No hyperdense vessel or unexpected calcification.

Skull: Normal. Negative for fracture or focal lesion.

Sinuses/Orbits: No acute finding.

Other: None.
IMPRESSION: 1. No acute intracranial process.

## 2023-01-21 ENCOUNTER — Encounter: Payer: Self-pay | Admitting: *Deleted
